# Patient Record
Sex: Female | Born: 1990 | Race: White | Hispanic: No | Marital: Single | Smoking: Never smoker
Health system: Southern US, Community
[De-identification: ages and names within clinical notes are randomized; demographics above are authoritative.]

## PROBLEM LIST (undated history)

## (undated) DIAGNOSIS — E063 Autoimmune thyroiditis: Secondary | ICD-10-CM

## (undated) HISTORY — PX: SHOULDER SURGERY: SHX246

## (undated) HISTORY — PX: NECK SURGERY: SHX720

---
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
## 2012-02-04 ENCOUNTER — Ambulatory Visit (INDEPENDENT_AMBULATORY_CARE_PROVIDER_SITE_OTHER): Payer: BC Managed Care – PPO | Admitting: Family Medicine

## 2012-02-04 VITALS — BP 114/74 | HR 88 | Temp 97.4°F | Resp 16 | Ht 68.0 in | Wt 142.0 lb

## 2012-02-04 DIAGNOSIS — M259 Joint disorder, unspecified: Secondary | ICD-10-CM

## 2012-02-04 DIAGNOSIS — M222X1 Patellofemoral disorders, right knee: Secondary | ICD-10-CM

## 2012-02-04 MED ORDER — TRAMADOL HCL 50 MG PO TABS: 50.0000 mg | ORAL_TABLET | Freq: Three times a day (TID) | ORAL | Status: AC | PRN

## 2012-02-04 NOTE — Progress Notes (Signed)
21 yo woman with known hypermobility of joints, including surgery on left shoulder.  Now here with 4 days of right knee pain.  No significant trauma recalled.  She has had knee pain in the past. Now taking advil without much relief.  Now wearing a brace as well.  [employer] student: fine arts  O:  Right knee:  Full ROM  No effusion  Nontender  Significant angle from quads to infrapatellar tendon:  At least 20 degrees  A:  Patello-femoral dysfunction  P:  Quad strengthening Tramadol

## 2012-02-04 NOTE — Patient Instructions (Signed)
Knee Exercises EXERCISES RANGE OF MOTION(ROM) AND STRETCHING EXERCISES These exercises may help you when beginning to rehabilitate your injury. Your symptoms may resolve with or without further involvement from your physician, physical therapist or athletic trainer. While completing these exercises, remember:   Restoring tissue flexibility helps normal motion to return to the joints. This allows healthier, less painful movement and activity.   An effective stretch should be held for at least 30 seconds.   A stretch should never be painful. You should only feel a gentle lengthening or release in the stretched tissue.  STRETCH - Knee Extension, Prone  Lie on your stomach on a firm surface, such as a bed or countertop. Place your right / left knee and leg just beyond the edge of the surface. You may wish to place a towel under the far end of your right / left thigh for comfort.   Relax your leg muscles and allow gravity to straighten your knee. Your clinician may advise you to add an ankle weight if more resistance is helpful for you.   You should feel a stretch in the back of your right / left knee. Hold this position for __________ seconds.  Repeat __________ times. Complete this stretch __________ times per day. * Your physician, physical therapist or athletic trainer may ask you to add ankle weight to enhance your stretch.  RANGE OF MOTION - Knee Flexion, Active  Lie on your back with both knees straight. (If this causes back discomfort, bend your opposite knee, placing your foot flat on the floor.)   Slowly slide your heel back toward your buttocks until you feel a gentle stretch in the front of your knee or thigh.   Hold for __________ seconds. Slowly slide your heel back to the starting position.  Repeat __________ times. Complete this exercise __________ times per day.  STRETCH - Quadriceps, Prone   Lie on your stomach on a firm surface, such as a bed or padded floor.   Bend your  right / left knee and grasp your ankle. If you are unable to reach, your ankle or pant leg, use a belt around your foot to lengthen your reach.   Gently pull your heel toward your buttocks. Your knee should not slide out to the side. You should feel a stretch in the front of your thigh and/or knee.   Hold this position for __________ seconds.  Repeat __________ times. Complete this stretch __________ times per day.  STRETCH - Hamstrings, Supine   Lie on your back. Loop a belt or towel over the ball of your right / left foot.   Straighten your right / left knee and slowly pull on the belt to raise your leg. Do not allow the right / left knee to bend. Keep your opposite leg flat on the floor.   Raise the leg until you feel a gentle stretch behind your right / left knee or thigh. Hold this position for __________ seconds.  Repeat __________ times. Complete this stretch __________ times per day.  STRENGTHENING EXERCISES These exercises may help you when beginning to rehabilitate your injury. They may resolve your symptoms with or without further involvement from your physician, physical therapist or athletic trainer. While completing these exercises, remember:   Muscles can gain both the endurance and the strength needed for everyday activities through controlled exercises.   Complete these exercises as instructed by your physician, physical therapist or athletic trainer. Progress the resistance and repetitions only as guided.     You may experience muscle soreness or fatigue, but the pain or discomfort you are trying to eliminate should never worsen during these exercises. If this pain does worsen, stop and make certain you are following the directions exactly. If the pain is still present after adjustments, discontinue the exercise until you can discuss the trouble with your clinician.  STRENGTH - Quadriceps, Isometrics  Lie on your back with your right / left leg extended and your opposite knee  bent.   Gradually tense the muscles in the front of your right / left thigh. You should see either your knee cap slide up toward your hip or increased dimpling just above the knee. This motion will push the back of the knee down toward the floor/mat/bed on which you are lying.   Hold the muscle as tight as you can without increasing your pain for __________ seconds.   Relax the muscles slowly and completely in between each repetition.  Repeat __________ times. Complete this exercise __________ times per day.  STRENGTH - Quadriceps, Short Arcs   Lie on your back. Place a __________ inch towel roll under your knee so that the knee slightly bends.   Raise only your lower leg by tightening the muscles in the front of your thigh. Do not allow your thigh to rise.   Hold this position for __________ seconds.  Repeat __________ times. Complete this exercise __________ times per day.  OPTIONAL ANKLE WEIGHTS: Begin with ____________________, but DO NOT exceed ____________________. Increase in 1 pound/0.5 kilogram increments.  STRENGTH - Quadriceps, Straight Leg Raises  Quality counts! Watch for signs that the quadriceps muscle is working to insure you are strengthening the correct muscles and not "cheating" by substituting with healthier muscles.  Lay on your back with your right / left leg extended and your opposite knee bent.   Tense the muscles in the front of your right / left thigh. You should see either your knee cap slide up or increased dimpling just above the knee. Your thigh may even quiver.   Tighten these muscles even more and raise your leg 4 to 6 inches off the floor. Hold for __________ seconds.   Keeping these muscles tense, lower your leg.   Relax the muscles slowly and completely in between each repetition.  Repeat __________ times. Complete this exercise __________ times per day.  STRENGTH - Hamstring, Curls  Lay on your stomach with your legs extended. (If you lay on a bed,  your feet may hang over the edge.)   Tighten the muscles in the back of your thigh to bend your right / left knee up to 90 degrees. Keep your hips flat on the bed/floor.   Hold this position for __________ seconds.   Slowly lower your leg back to the starting position.  Repeat __________ times. Complete this exercise __________ times per day.  OPTIONAL ANKLE WEIGHTS: Begin with ____________________, but DO NOT exceed ____________________. Increase in 1 pound/0.5 kilogram increments.  STRENGTH - Quadriceps, Squats  Stand in a door frame so that your feet and knees are in line with the frame.   Use your hands for balance, not support, on the frame.   Slowly lower your weight, bending at the hips and knees. Keep your lower legs upright so that they are parallel with the door frame. Squat only within the range that does not increase your knee pain. Never let your hips drop below your knees.   Slowly return upright, pushing with your legs, not pulling with   your hands.  Repeat __________ times. Complete this exercise __________ times per day.  STRENGTH - Quadriceps, Wall Slides  Follow guidelines for form closely. Increased knee pain often results from poorly placed feet or knees.  Lean against a smooth wall or door and walk your feet out 18-24 inches. Place your feet hip-width apart.   Slowly slide down the wall or door until your knees bend __________ degrees.* Keep your knees over your heels, not your toes, and in line with your hips, not falling to either side.   Hold for __________ seconds. Stand up to rest for __________ seconds in between each repetition.  Repeat __________ times. Complete this exercise __________ times per day. * Your physician, physical therapist or athletic trainer will alter this angle based on your symptoms and progress. Document Released: 08/03/2005 Document Revised: 09/08/2011 Document Reviewed: 01/01/2009 [employer] Patient Information 2012 [employer]. 

## 2013-12-15 ENCOUNTER — Encounter (HOSPITAL_COMMUNITY): Payer: Self-pay | Admitting: Emergency Medicine

## 2013-12-15 ENCOUNTER — Emergency Department (HOSPITAL_COMMUNITY)
Admission: EM | Admit: 2013-12-15 | Discharge: 2013-12-16 | Disposition: A | Discharge status: Home / Self Care | Payer: BC Managed Care – PPO | Attending: Emergency Medicine | Admitting: Emergency Medicine

## 2013-12-15 DIAGNOSIS — A088 Other specified intestinal infections: Secondary | ICD-10-CM | POA: Insufficient documentation

## 2013-12-15 DIAGNOSIS — Z8639 Personal history of other endocrine, nutritional and metabolic disease: Secondary | ICD-10-CM | POA: Insufficient documentation

## 2013-12-15 DIAGNOSIS — Z3202 Encounter for pregnancy test, result negative: Secondary | ICD-10-CM | POA: Insufficient documentation

## 2013-12-15 DIAGNOSIS — R0602 Shortness of breath: Secondary | ICD-10-CM | POA: Insufficient documentation

## 2013-12-15 DIAGNOSIS — R6883 Chills (without fever): Secondary | ICD-10-CM | POA: Insufficient documentation

## 2013-12-15 DIAGNOSIS — A084 Viral intestinal infection, unspecified: Secondary | ICD-10-CM

## 2013-12-15 DIAGNOSIS — Z862 Personal history of diseases of the blood and blood-forming organs and certain disorders involving the immune mechanism: Secondary | ICD-10-CM | POA: Insufficient documentation

## 2013-12-15 DIAGNOSIS — Z79899 Other long term (current) drug therapy: Secondary | ICD-10-CM | POA: Insufficient documentation

## 2013-12-15 HISTORY — DX: Autoimmune thyroiditis: E06.3

## 2013-12-15 LAB — CBC WITH DIFFERENTIAL/PLATELET
BASOS PCT: 0 % (ref 0–1)
Basophils Absolute: 0 10*3/uL (ref 0.0–0.1)
Eosinophils Absolute: 0 10*3/uL (ref 0.0–0.7)
Eosinophils Relative: 0 % (ref 0–5)
HCT: 42.6 % (ref 36.0–46.0)
HEMOGLOBIN: 14.4 g/dL (ref 12.0–15.0)
LYMPHS ABS: 0.7 10*3/uL (ref 0.7–4.0)
Lymphocytes Relative: 9 % — ABNORMAL LOW (ref 12–46)
MCH: 30.4 pg (ref 26.0–34.0)
MCHC: 33.8 g/dL (ref 30.0–36.0)
MCV: 89.9 fL (ref 78.0–100.0)
MONO ABS: 0.8 10*3/uL (ref 0.1–1.0)
MONOS PCT: 10 % (ref 3–12)
NEUTROS ABS: 6.5 10*3/uL (ref 1.7–7.7)
NEUTROS PCT: 81 % — AB (ref 43–77)
Platelets: 281 10*3/uL (ref 150–400)
RBC: 4.74 MIL/uL (ref 3.87–5.11)
RDW: 12.7 % (ref 11.5–15.5)
WBC: 8 10*3/uL (ref 4.0–10.5)

## 2013-12-15 LAB — COMPREHENSIVE METABOLIC PANEL
ALK PHOS: 48 U/L (ref 39–117)
ALT: 15 U/L (ref 0–35)
AST: 27 U/L (ref 0–37)
Albumin: 3.8 g/dL (ref 3.5–5.2)
BILIRUBIN TOTAL: 0.5 mg/dL (ref 0.3–1.2)
BUN: 11 mg/dL (ref 6–23)
CALCIUM: 9.8 mg/dL (ref 8.4–10.5)
CHLORIDE: 98 meq/L (ref 96–112)
CO2: 20 mEq/L (ref 19–32)
Creatinine, Ser: 0.73 mg/dL (ref 0.50–1.10)
GLUCOSE: 115 mg/dL — AB (ref 70–99)
POTASSIUM: 3.7 meq/L (ref 3.7–5.3)
SODIUM: 135 meq/L — AB (ref 137–147)
Total Protein: 7.3 g/dL (ref 6.0–8.3)

## 2013-12-15 LAB — PREGNANCY, URINE: PREG TEST UR: NEGATIVE

## 2013-12-15 LAB — LIPASE, BLOOD: Lipase: 15 U/L (ref 11–59)

## 2013-12-15 MED ORDER — ONDANSETRON HCL 4 MG/2ML IJ SOLN
4.0000 mg | INTRAMUSCULAR | Status: AC
  Administered 2013-12-15: 4 mg via INTRAVENOUS
  Filled 2013-12-15: qty 2

## 2013-12-15 MED ORDER — MORPHINE SULFATE 4 MG/ML IJ SOLN
4.0000 mg | Freq: Once | INTRAMUSCULAR | Status: AC
  Administered 2013-12-16: 4 mg via INTRAVENOUS
  Filled 2013-12-15: qty 1

## 2013-12-15 MED ORDER — SODIUM CHLORIDE 0.9 % IV BOLUS (SEPSIS)
1000.0000 mL | Freq: Once | INTRAVENOUS | Status: AC
  Administered 2013-12-16: 1000 mL via INTRAVENOUS

## 2013-12-15 MED ORDER — MORPHINE SULFATE 4 MG/ML IJ SOLN
2.0000 mg | Freq: Once | INTRAMUSCULAR | Status: AC
  Administered 2013-12-15: 2 mg via INTRAVENOUS
  Filled 2013-12-15: qty 1

## 2013-12-15 NOTE — ED Notes (Addendum)
Pt reports that she has bilateral lower abdominal pain with n/v/d since 2130 yesterday, reports multiple episodes of vomiting and diarrhea. Reports she has been able to keep fluids down since 1400, but still reports nausea at this time. States she is lightheaded and weak. Denies being around any known sick contacts. Pt a&o x4, ambulatory to triage.

## 2013-12-15 NOTE — ED Provider Notes (Signed)
CSN: [id number]     Arrival date & time 12/15/13  2206 History  This chart was scribed for non-physician practitioner Antony MaduraKelly Darrell Leonhardt, PA-C working with Richardean Canalavid H Yao, MD by Dorothey Basemania Sutton, ED Scribe. This patient was seen in room WA10/WA10 and the patient's care was started at 10:22 PM.    Chief Complaint  Patient presents with  . Abdominal Pain  . Emesis   Patient is a 23 y.o. female presenting with abdominal pain. The history is provided by the patient. No language interpreter was used.  Abdominal Pain Pain location:  LLQ and RLQ Pain quality: sharp   Pain radiates to:  Does not radiate Pain severity:  Moderate Onset quality:  Gradual Timing:  Constant Progression:  Unchanged Chronicity:  New Context: not sick contacts   Relieved by:  Nothing Worsened by:  Eating Associated symptoms: chills, diarrhea, nausea, shortness of breath and vomiting   Associated symptoms: no chest pain, no fever, no vaginal bleeding and no vaginal discharge   Diarrhea:    Quality:  Watery   Severity:  Moderate   Timing:  Intermittent   Progression:  Resolved Nausea:    Severity:  Moderate   Onset quality:  Gradual   Timing:  Constant   Progression:  Unchanged Shortness of breath:    Severity:  Mild   Onset quality:  Gradual   Timing:  Sporadic   Progression:  Unchanged Vomiting:    Quality:  Stomach contents   Severity:  Moderate   Timing:  Intermittent   Progression:  Resolved Risk factors: not elderly, has not had multiple surgeries and not obese    HPI Comments: Julia Weaver is a 23 y.o. female who presents to the Emergency Department complaining of an intermittent, sharp pain to the bilateral lower quadrants of the abdomen with associated nausea and multiple episodes of non-bilious, non-bloody emesis and diarrhea onset around 24 hours ago. Patient states that the emesis resolved around 8 hours ago and last episode of diarrhea was 2.5 hours ago, but the abdominal pain and nausea has been persistent.  She reports some associated decreased appetite and states that the pain is exacerbated with eating and drinking. She reports associated chills and some shortness of breath during the episodes of pain. She denies any sick contacts or history of abdominal surgeries. She denies fever, chest pain, vaginal bleeding or discharge, rash. Patient reports her LMP was 12/04/2013 and was normal. Patient has a history of Hashimoto's disease.   Past Medical History  Diagnosis Date  . Hashimoto's disease    Past Surgical History  Procedure Laterality Date  . Neck surgery      cyst  . Shoulder surgery Left     torn labrum  . Tonsillectomy  2000   History reviewed. No pertinent family history. History  Substance Use Topics  . Smoking status: Never Smoker   . Smokeless tobacco: Never Used  . Alcohol Use: No   OB History   Grav Para Term Preterm Abortions TAB SAB Ect Mult Living                 Review of Systems  Constitutional: Positive for chills and appetite change (decreased). Negative for fever.  Respiratory: Positive for shortness of breath.   Cardiovascular: Negative for chest pain.  Gastrointestinal: Positive for nausea, vomiting, abdominal pain and diarrhea.  Genitourinary: Negative for vaginal bleeding and vaginal discharge.  Skin: Negative for rash.  All other systems reviewed and are negative.    Allergies  Pineapple  Home Medications   Current Outpatient Rx  Name  Route  Sig  Dispense  Refill  . fluconazole (DIFLUCAN) 150 MG tablet   Oral   Take 150 mg by mouth once.          Babs Bertin FE 0.8-25 MG-MCG tablet   Oral   Chew 1 tablet by mouth daily.          Marland Kitchen ibuprofen (ADVIL,MOTRIN) 200 MG tablet   Oral   Take 600 mg by mouth every 8 (eight) hours as needed for fever or moderate pain.         Marland Kitchen levothyroxine (SYNTHROID, LEVOTHROID) 50 MCG tablet   Oral   Take 50 mcg by mouth daily before breakfast.          . ondansetron (ZOFRAN) 4 MG tablet   Oral    Take 1 tablet (4 mg total) by mouth every 6 (six) hours.   12 tablet   0   . oxyCODONE-acetaminophen (PERCOCET/ROXICET) 5-325 MG per tablet   Oral   Take 1-2 tablets by mouth every 6 (six) hours as needed for severe pain.   17 tablet   0    Triage Vitals: BP 160/107  Pulse 104  Temp(Src) 97.6 F (36.4 C) (Oral)  Resp 20  SpO2 98%  LMP 12/04/2013  Physical Exam  Nursing note and vitals reviewed. Constitutional: She is oriented to person, place, and time. She appears well-developed and well-nourished. No distress.  HENT:  Head: Normocephalic and atraumatic.  Mouth/Throat: No oropharyngeal exudate.  Eyes: Conjunctivae and EOM are normal. Pupils are equal, round, and reactive to light. No scleral icterus.  Neck: Normal range of motion.  Cardiovascular: Normal rate, regular rhythm and normal heart sounds.   Pulmonary/Chest: Effort normal and breath sounds normal. No respiratory distress. She has no wheezes. She has no rales.  Abdominal: Soft. Bowel sounds are normal. She exhibits no distension and no mass. There is tenderness.  Diffuse tenderness to palpation. No focal tenderness. No peritoneal signs.   Musculoskeletal: Normal range of motion.  Neurological: She is alert and oriented to person, place, and time.  Skin: Skin is warm and dry. No rash noted. She is not diaphoretic. No erythema. No pallor.  Psychiatric: She has a normal mood and affect. Her behavior is normal.    ED Course  Procedures (including critical care time)  DIAGNOSTIC STUDIES: Oxygen Saturation is 98% on room air, normal by my interpretation.    COORDINATION OF CARE: 10:25 PM- Discussed that symptoms are likely viral in nature. Will order blood labs (CMP, CBC, lipase) and UA. Will order Zofran and morphine to manage symptoms. Discussed treatment plan with patient at bedside and patient verbalized agreement.    Labs Review Labs Reviewed  CBC WITH DIFFERENTIAL - Abnormal; Notable for the following:     Neutrophils Relative % 81 (*)    Lymphocytes Relative 9 (*)    All other components within normal limits  URINALYSIS, ROUTINE W REFLEX MICROSCOPIC - Abnormal; Notable for the following:    Color, Urine AMBER (*)    APPearance CLOUDY (*)    Hgb urine dipstick TRACE (*)    Protein, ur 30 (*)    Leukocytes, UA SMALL (*)    All other components within normal limits  COMPREHENSIVE METABOLIC PANEL - Abnormal; Notable for the following:    Sodium 135 (*)    Glucose, Bld 115 (*)    All other components within normal limits  URINE MICROSCOPIC-ADD ON - Abnormal;  Notable for the following:    Squamous Epithelial / LPF MANY (*)    Bacteria, UA MANY (*)    All other components within normal limits  PREGNANCY, URINE  LIPASE, BLOOD   Imaging Review No results found.   EKG Interpretation None      MDM   Final diagnoses:  Viral gastroenteritis    4833 - 23 year old female presents to the emergency department for abdominal pain in the presence of nausea, vomiting, and diarrhea. Patient endorses multiple episodes of emesis in nonbloody, watery diarrhea. She has also had an intolerance to fluids since 2 PM yesterday. PO intake worsens pain, per patient. On physical exam, patient with diffuse abdominal tenderness. No focal tenderness or peritoneal signs appreciated. Patient appears in moderate discomfort. Suspect viral gastroenteritis in this patient. Will further evaluate with CBC, CMP, lipase, urinalysis and urine pregnancy. Will hold imaging and instead reassess with serial abdominal examinations. Morphine, Zofran, and IV fluids ordered for management.  0015 - On abdominal reexamination, patient with stable tenderness. No focal tenderness or peritoneal signs appreciated. Will order additional pain medicine for pain control. Patient also still complaining of nausea. Will order Phenergan.  0100 - abdominal examination improved. Patient still complaining of mild abdominal discomfort. Will order  Toradol and transitioned to oral pain medicine. Fluid challenge ordered.  0200 - Patient was much improved abdominal exam. She has no significant tenderness to palpation. No peritoneal signs, still, appreciated. Patient has been tolerating fluids in ED without emesis. She states she believes she can manage her symptoms further at home. I do not believe further emergent workup is indicated at this time. My suspicion for SBO and pSBO is low given the ability to tolerate fluids as well as frequent bowel movements. No TTP at McBurney's point to suggest appendicitis. No +Murphy's sign to suggest cholecystitis. Patient stable and appropriate for discharge with prescription for Zofran to take for persistent nausea/emesis and Percocet to take as needed for pain control. Bland diet advised and return precautions discussed. Patient agreeable to plan with no unaddressed concerns.  I personally performed the services described in this documentation, which was scribed in my presence. The recorded information has been reviewed and is accurate.   Filed Vitals:   12/15/13 2212 12/16/13 0155  BP: 160/107 127/74  Pulse: 104 74  Temp: 97.6 F (36.4 C) 97.6 F (36.4 C)  TempSrc: Oral Oral  Resp: 20 18  SpO2: 98% 100%       Antony Madura, PA-C 12/16/13 [phone]

## 2013-12-16 LAB — URINALYSIS, ROUTINE W REFLEX MICROSCOPIC
Bilirubin Urine: NEGATIVE
GLUCOSE, UA: NEGATIVE mg/dL
Ketones, ur: NEGATIVE mg/dL
Nitrite: NEGATIVE
PH: 5.5 (ref 5.0–8.0)
Protein, ur: 30 mg/dL — AB
SPECIFIC GRAVITY, URINE: 1.03 (ref 1.005–1.030)
Urobilinogen, UA: 0.2 mg/dL (ref 0.0–1.0)

## 2013-12-16 LAB — URINE MICROSCOPIC-ADD ON

## 2013-12-16 MED ORDER — ONDANSETRON HCL 4 MG PO TABS: 4.0000 mg | ORAL_TABLET | Freq: Four times a day (QID) | ORAL | Status: AC

## 2013-12-16 MED ORDER — PROMETHAZINE HCL 25 MG/ML IJ SOLN
12.5000 mg | Freq: Once | INTRAMUSCULAR | Status: AC
  Administered 2013-12-16: 12.5 mg via INTRAVENOUS
  Filled 2013-12-16: qty 1

## 2013-12-16 MED ORDER — OXYCODONE-ACETAMINOPHEN 5-325 MG PO TABS
2.0000 | ORAL_TABLET | Freq: Once | ORAL | Status: AC
  Administered 2013-12-16: 2 via ORAL
  Filled 2013-12-16: qty 2

## 2013-12-16 MED ORDER — KETOROLAC TROMETHAMINE 30 MG/ML IJ SOLN
30.0000 mg | Freq: Once | INTRAMUSCULAR | Status: AC
  Administered 2013-12-16: 30 mg via INTRAVENOUS
  Filled 2013-12-16: qty 1

## 2013-12-16 MED ORDER — SODIUM CHLORIDE 0.9 % IV BOLUS (SEPSIS)
500.0000 mL | Freq: Once | INTRAVENOUS | Status: AC
  Administered 2013-12-16: 500 mL via INTRAVENOUS

## 2013-12-16 MED ORDER — OXYCODONE-ACETAMINOPHEN 5-325 MG PO TABS: 1.0000 | ORAL_TABLET | Freq: Four times a day (QID) | ORAL | Status: AC | PRN

## 2013-12-16 NOTE — Discharge Instructions (Signed)
Recommend that you continue with a bland diet and clear liquids. Refrain from drinking milk products and eating fatty or greasy foods. Takes Zofran as needed for persistent nausea/vomiting. You may take Percocet as prescribed for severe pain. Followup with your primary care doctor to ensure that symptoms resolved. Return if symptoms worsen.  Viral Gastroenteritis Viral gastroenteritis is also known as stomach flu. This condition affects the stomach and intestinal tract. It can cause sudden diarrhea and vomiting. The illness typically lasts 3 to 8 days. Most people develop an immune response that eventually gets rid of the virus. While this natural response develops, the virus can make you quite ill. CAUSES  Many different viruses can cause gastroenteritis, such as rotavirus or noroviruses. You can catch one of these viruses by consuming contaminated food or water. You may also catch a virus by sharing utensils or other personal items with an infected person or by touching a contaminated surface. SYMPTOMS  The most common symptoms are diarrhea and vomiting. These problems can cause a severe loss of body fluids (dehydration) and a body salt (electrolyte) imbalance. Other symptoms may include:  Fever.  Headache.  Fatigue.  Abdominal pain. DIAGNOSIS  Your caregiver can usually diagnose viral gastroenteritis based on your symptoms and a physical exam. A stool sample may also be taken to test for the presence of viruses or other infections. TREATMENT  This illness typically goes away on its own. Treatments are aimed at rehydration. The most serious cases of viral gastroenteritis involve vomiting so severely that you are not able to keep fluids down. In these cases, fluids must be given through an intravenous line (IV). HOME CARE INSTRUCTIONS   Drink enough fluids to keep your urine clear or pale yellow. Drink small amounts of fluids frequently and increase the amounts as tolerated.  Ask your  caregiver for specific rehydration instructions.  Avoid:  Foods high in sugar.  Alcohol.  Carbonated drinks.  Tobacco.  Juice.  Caffeine drinks.  Extremely hot or cold fluids.  Fatty, greasy foods.  Too much intake of anything at one time.  Dairy products until 24 to 48 hours after diarrhea stops.  You may consume probiotics. Probiotics are active cultures of beneficial bacteria. They may lessen the amount and number of diarrheal stools in adults. Probiotics can be found in yogurt with active cultures and in supplements.  Wash your hands well to avoid spreading the virus.  Only take over-the-counter or prescription medicines for pain, discomfort, or fever as directed by your caregiver. Do not give aspirin to children. Antidiarrheal medicines are not recommended.  Ask your caregiver if you should continue to take your regular prescribed and over-the-counter medicines.  Keep all follow-up appointments as directed by your caregiver. SEEK IMMEDIATE MEDICAL CARE IF:   You are unable to keep fluids down.  You do not urinate at least once every 6 to 8 hours.  You develop shortness of breath.  You notice blood in your stool or vomit. This may look like coffee grounds.  You have abdominal pain that increases or is concentrated in one small area (localized).  You have persistent vomiting or diarrhea.  You have a fever.  The patient is a child younger than 3 months, and he or she has a fever.  The patient is a child older than 3 months, and he or she has a fever and persistent symptoms.  The patient is a child older than 3 months, and he or she has a fever and symptoms suddenly  get worse.  The patient is a baby, and he or she has no tears when crying. MAKE SURE YOU:   Understand these instructions.  Will watch your condition.  Will get help right away if you are not doing well or get worse. Document Released: 09/19/2005 Document Revised: 12/12/2011 Document  Reviewed: 07/06/2011 [facility] Patient Information 2014 Stacey StreetExitCare, MarylandLLC.

## 2013-12-19 NOTE — ED Provider Notes (Signed)
Medical screening examination/treatment/procedure(s) were performed by non-physician practitioner and as supervising physician I was immediately available for consultation/collaboration.   EKG Interpretation None        Richardean Canalavid H Lash Matulich, MD 12/19/13 [id number]

## 2014-10-14 ENCOUNTER — Other Ambulatory Visit (INDEPENDENT_AMBULATORY_CARE_PROVIDER_SITE_OTHER): Payer: Self-pay

## 2014-10-14 DIAGNOSIS — IMO0002 Reserved for concepts with insufficient information to code with codable children: Secondary | ICD-10-CM

## 2014-10-14 DIAGNOSIS — R229 Localized swelling, mass and lump, unspecified: Principal | ICD-10-CM

## 2014-10-14 NOTE — Addendum Note (Signed)
Addended by: Axel FillerAMIREZ, Hari Casaus on: 10/14/2014 11:10 AM   Modules accepted: Orders

## 2014-10-15 ENCOUNTER — Other Ambulatory Visit (INDEPENDENT_AMBULATORY_CARE_PROVIDER_SITE_OTHER): Payer: Self-pay | Admitting: *Deleted

## 2014-10-15 DIAGNOSIS — M7918 Myalgia, other site: Secondary | ICD-10-CM

## 2014-10-15 DIAGNOSIS — IMO0002 Reserved for concepts with insufficient information to code with codable children: Secondary | ICD-10-CM

## 2014-10-15 DIAGNOSIS — R229 Localized swelling, mass and lump, unspecified: Principal | ICD-10-CM

## 2014-10-16 ENCOUNTER — Other Ambulatory Visit (INDEPENDENT_AMBULATORY_CARE_PROVIDER_SITE_OTHER): Payer: Self-pay | Admitting: *Deleted

## 2014-10-16 DIAGNOSIS — R229 Localized swelling, mass and lump, unspecified: Principal | ICD-10-CM

## 2014-10-16 DIAGNOSIS — M7918 Myalgia, other site: Secondary | ICD-10-CM

## 2014-10-16 DIAGNOSIS — IMO0002 Reserved for concepts with insufficient information to code with codable children: Secondary | ICD-10-CM

## 2014-10-16 NOTE — Addendum Note (Signed)
Addended by: Axel FillerAMIREZ, Zaina Jenkin on: 10/16/2014 10:36 AM   Modules accepted: Orders

## 2014-10-20 ENCOUNTER — Other Ambulatory Visit: Payer: Self-pay

## 2014-10-20 ENCOUNTER — Other Ambulatory Visit: Payer: Self-pay | Admitting: General Surgery

## 2014-10-20 DIAGNOSIS — R52 Pain, unspecified: Secondary | ICD-10-CM

## 2014-10-20 DIAGNOSIS — M7918 Myalgia, other site: Secondary | ICD-10-CM

## 2014-10-24 ENCOUNTER — Other Ambulatory Visit: Payer: Self-pay

## 2014-10-29 ENCOUNTER — Ambulatory Visit
Admission: RE | Admit: 2014-10-29 | Discharge: 2014-10-29 | Disposition: A | Discharge status: Home / Self Care | Payer: BLUE CROSS/BLUE SHIELD | Source: Ambulatory Visit | Attending: General Surgery | Admitting: General Surgery

## 2014-10-29 DIAGNOSIS — M7918 Myalgia, other site: Secondary | ICD-10-CM

## 2014-10-30 ENCOUNTER — Other Ambulatory Visit: Payer: Self-pay | Admitting: General Surgery

## 2014-10-30 DIAGNOSIS — R229 Localized swelling, mass and lump, unspecified: Principal | ICD-10-CM

## 2014-10-30 DIAGNOSIS — IMO0002 Reserved for concepts with insufficient information to code with codable children: Secondary | ICD-10-CM

## 2014-10-31 ENCOUNTER — Ambulatory Visit
Admission: RE | Admit: 2014-10-31 | Discharge: 2014-10-31 | Disposition: A | Discharge status: Home / Self Care | Payer: BLUE CROSS/BLUE SHIELD | Source: Ambulatory Visit | Attending: General Surgery | Admitting: General Surgery

## 2014-10-31 DIAGNOSIS — R229 Localized swelling, mass and lump, unspecified: Principal | ICD-10-CM

## 2014-10-31 DIAGNOSIS — IMO0002 Reserved for concepts with insufficient information to code with codable children: Secondary | ICD-10-CM

## 2014-10-31 MED ORDER — IOHEXOL 300 MG/ML  SOLN
125.0000 mL | Freq: Once | INTRAMUSCULAR | Status: AC | PRN
  Administered 2014-10-31: 125 mL via INTRAVENOUS

## 2016-06-16 IMAGING — CT CT PELVIS W/ CM
2 of 3 series · 14 of 36 positions shown, 19 images · IV contrast ([ID] [id number])
Comparison: 10/29/2014.

CLINICAL DATA: Palpable abnormality in the right gluteal area,
previous slip and fall with injury to the right buttocks region

EXAM:
CT PELVIS WITH CONTRAST
TECHNIQUE: Multidetector CT imaging of the pelvis was performed using the
standard protocol following the bolus administration of intravenous
contrast.
CONTRAST:  125mL OMNIPAQUE IOHEXOL 300 MG/ML  SOLN

[Series 601: coronal body · coronal · 0.94mm/px · 1 of 140 slices shown, 2 images]
[im 47/140  soft-tissue]
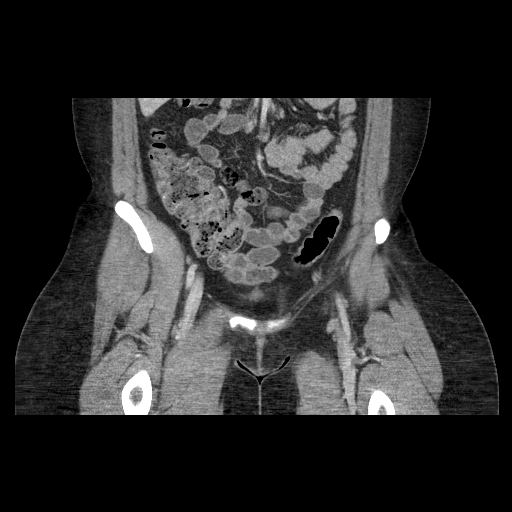
[im 47/140  bone]
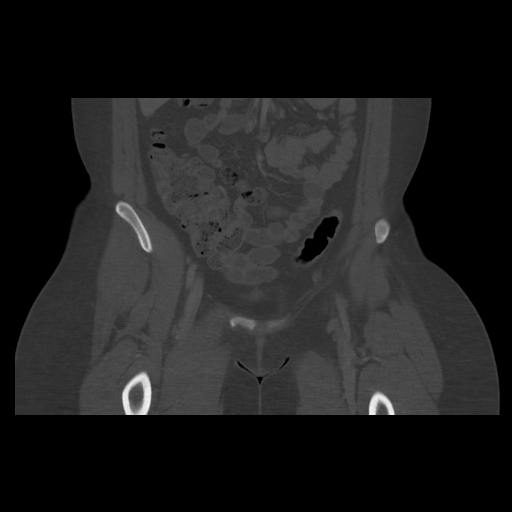

[Series 602: sagittal body · sagittal · 0.94mm/px · 13 of 189 slices shown, 17 images]
[im 8/189  lung]
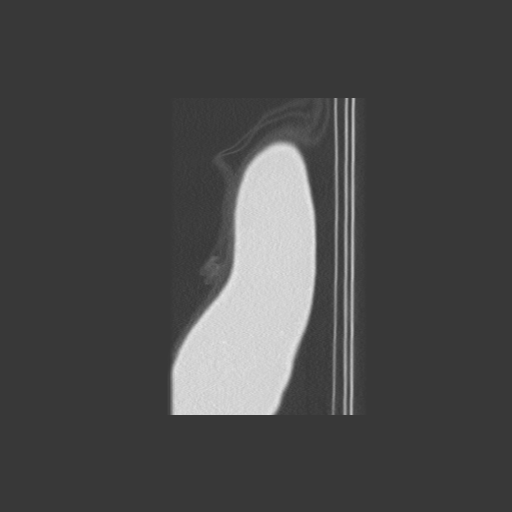
[im 16/189  soft-tissue]
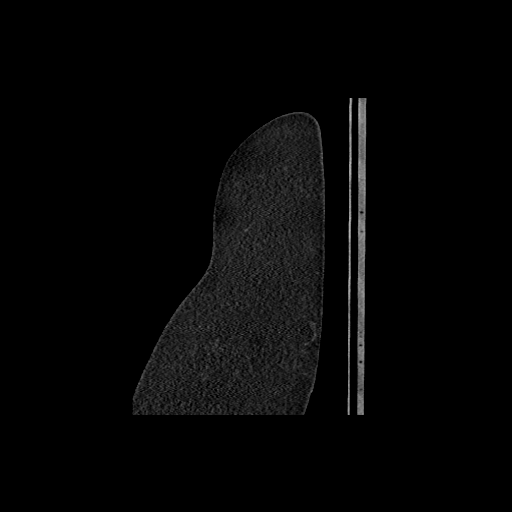
[im 16/189  lung]
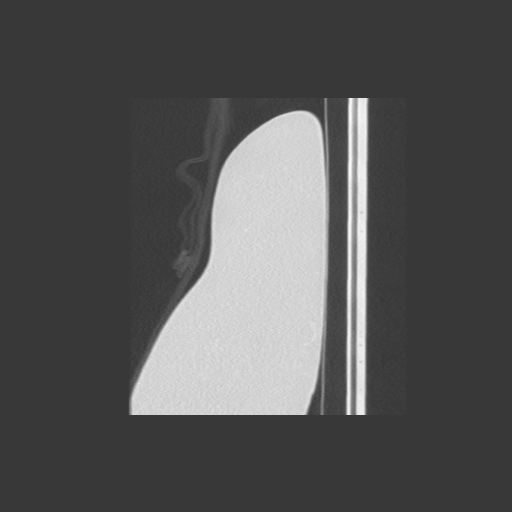
[im 16/189  bone]
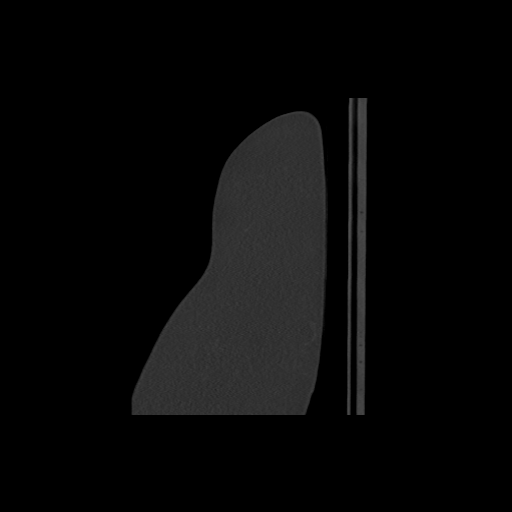
[im 23/189  lung]
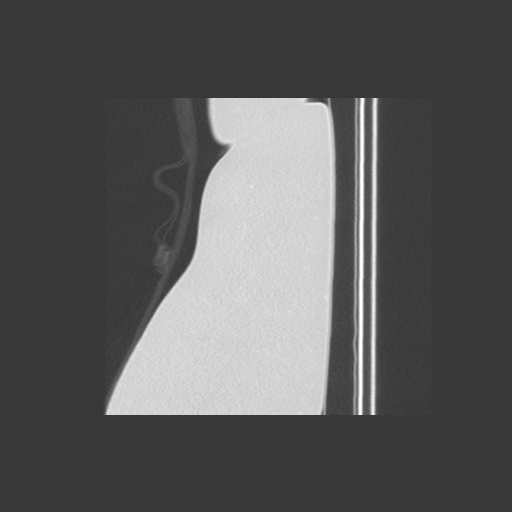
[im 31/189  soft-tissue]
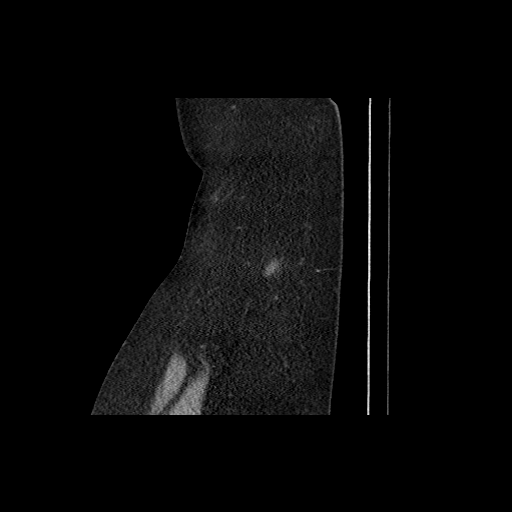
[im 31/189  lung]
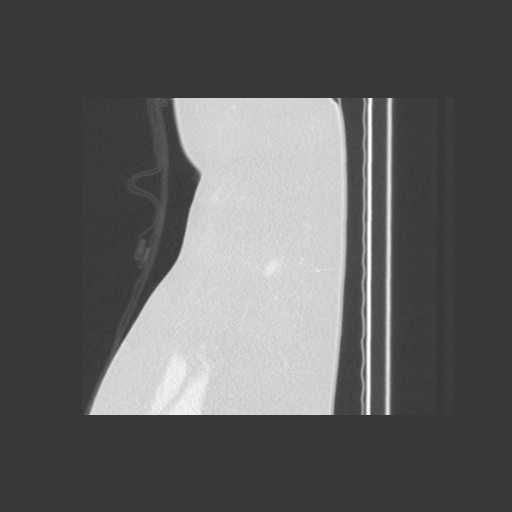
[im 46/189  soft-tissue]
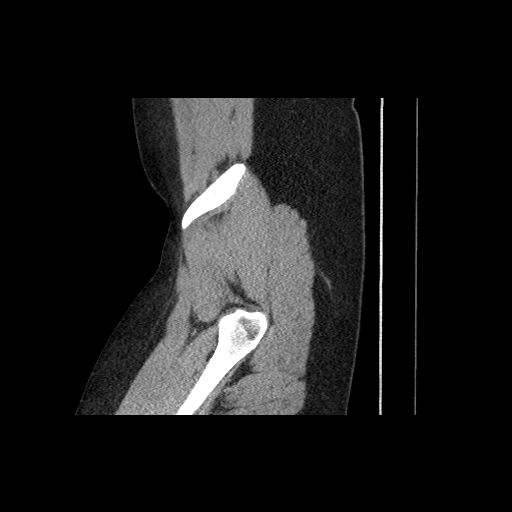
[im 61/189  soft-tissue]
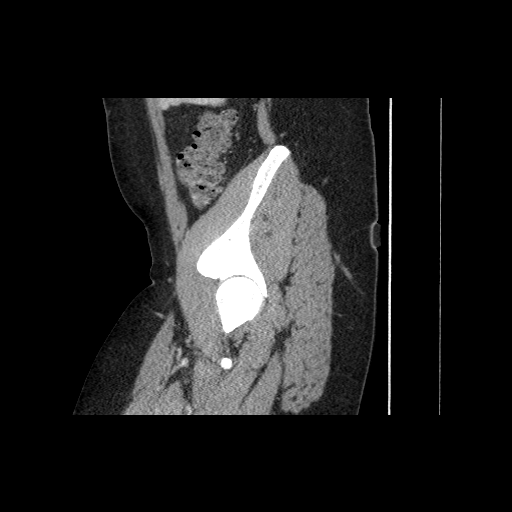
[im 76/189  soft-tissue]
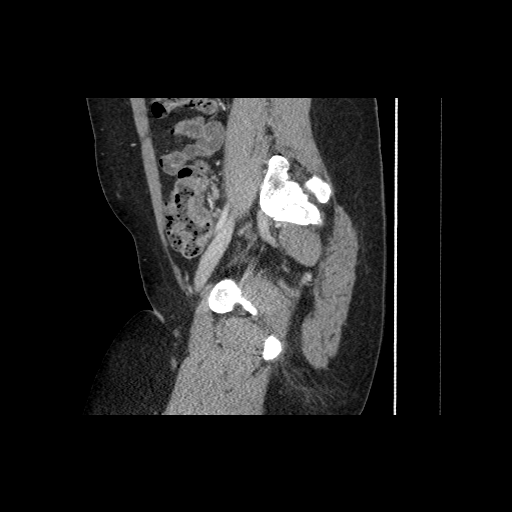
[im 98/189  soft-tissue]
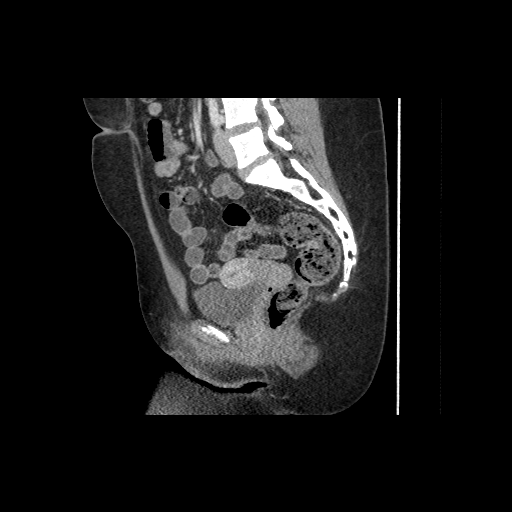
[im 113/189  soft-tissue]
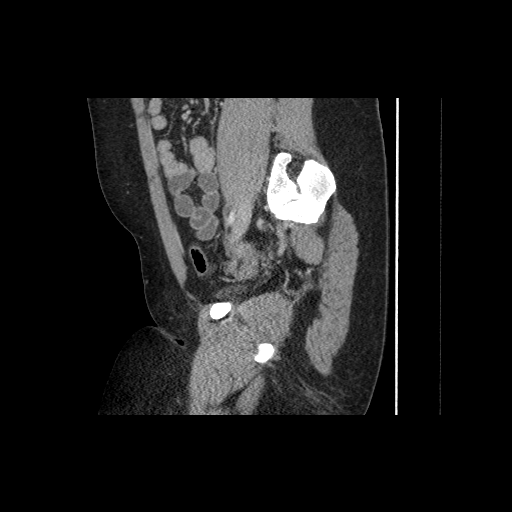
[im 128/189  soft-tissue]
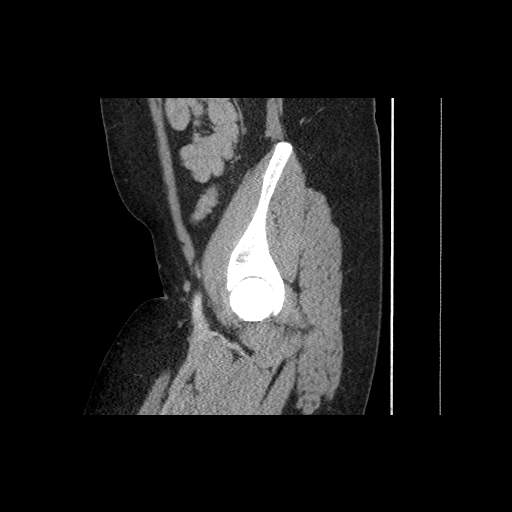
[im 143/189  soft-tissue]
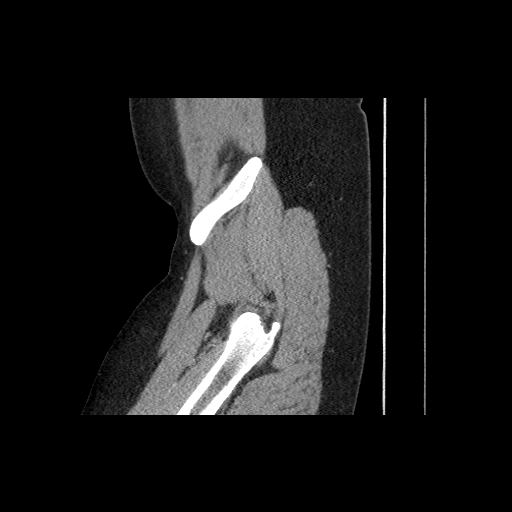
[im 143/189  bone]
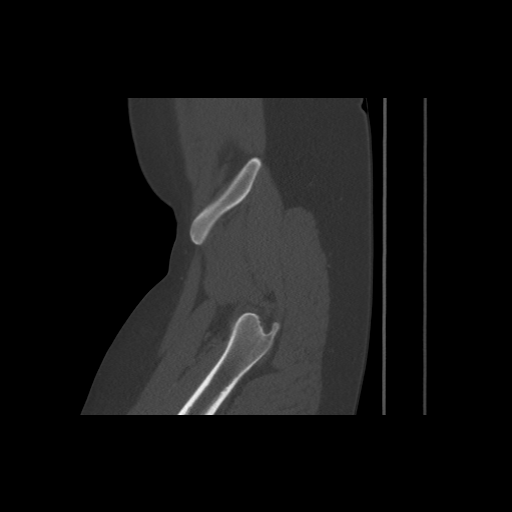
[im 158/189  soft-tissue]
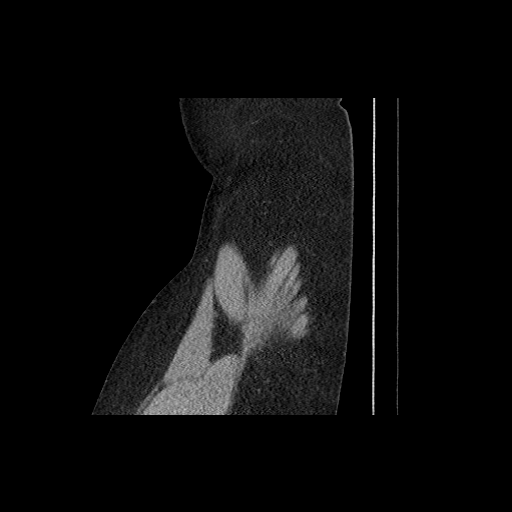
[im 173/189  soft-tissue]
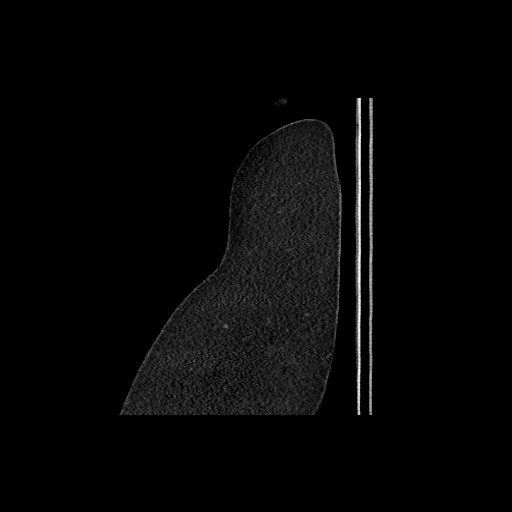

[14 of 36 positions shown; findings below may reference images not displayed]

FINDINGS: In the area of clinical concern which is been marked by the patient
there is no underlying soft tissue mass lesion. Some mild linear
density is noted within the subcutaneous fat likely related to the
patient's previous injury. No focal hematoma is seen. The
musculature is unremarkable. The bony structures show no acute
abnormality.

The structures within the pelvis show the bladder to be well
distended. No pelvic mass lesion or sidewall abnormality is noted.
No lymphadenopathy is seen.
IMPRESSION: No abnormality to correspond with the patient's given clinical
symptomatology. Very minimal changes likely related to the prior
injury or seen within the subcutaneous fat.

## 2016-08-04 ENCOUNTER — Encounter: Payer: Self-pay | Admitting: Internal Medicine

## 2017-03-09 ENCOUNTER — Telehealth: Payer: Self-pay | Admitting: General Practice

## 2017-03-09 NOTE — Telephone Encounter (Signed)
Adel[facility]el[facility] called to check the status of a referral for the patient. Please advise.
